# Patient Record
Sex: Male | Born: 1985 | Race: Black or African American | Hispanic: No | Marital: Married
Health system: Southern US, Community
[De-identification: ages and names within clinical notes are randomized; demographics above are authoritative.]

---
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
## 2006-03-04 IMAGING — CR DG ABDOMEN 1V
1 series · 1 of 1 positions shown · non-contrast
Comparison: None.

CLINICAL DATA: Abdominal pain.
 SINGLE VIEW ABDOMEN:

[view not recorded]
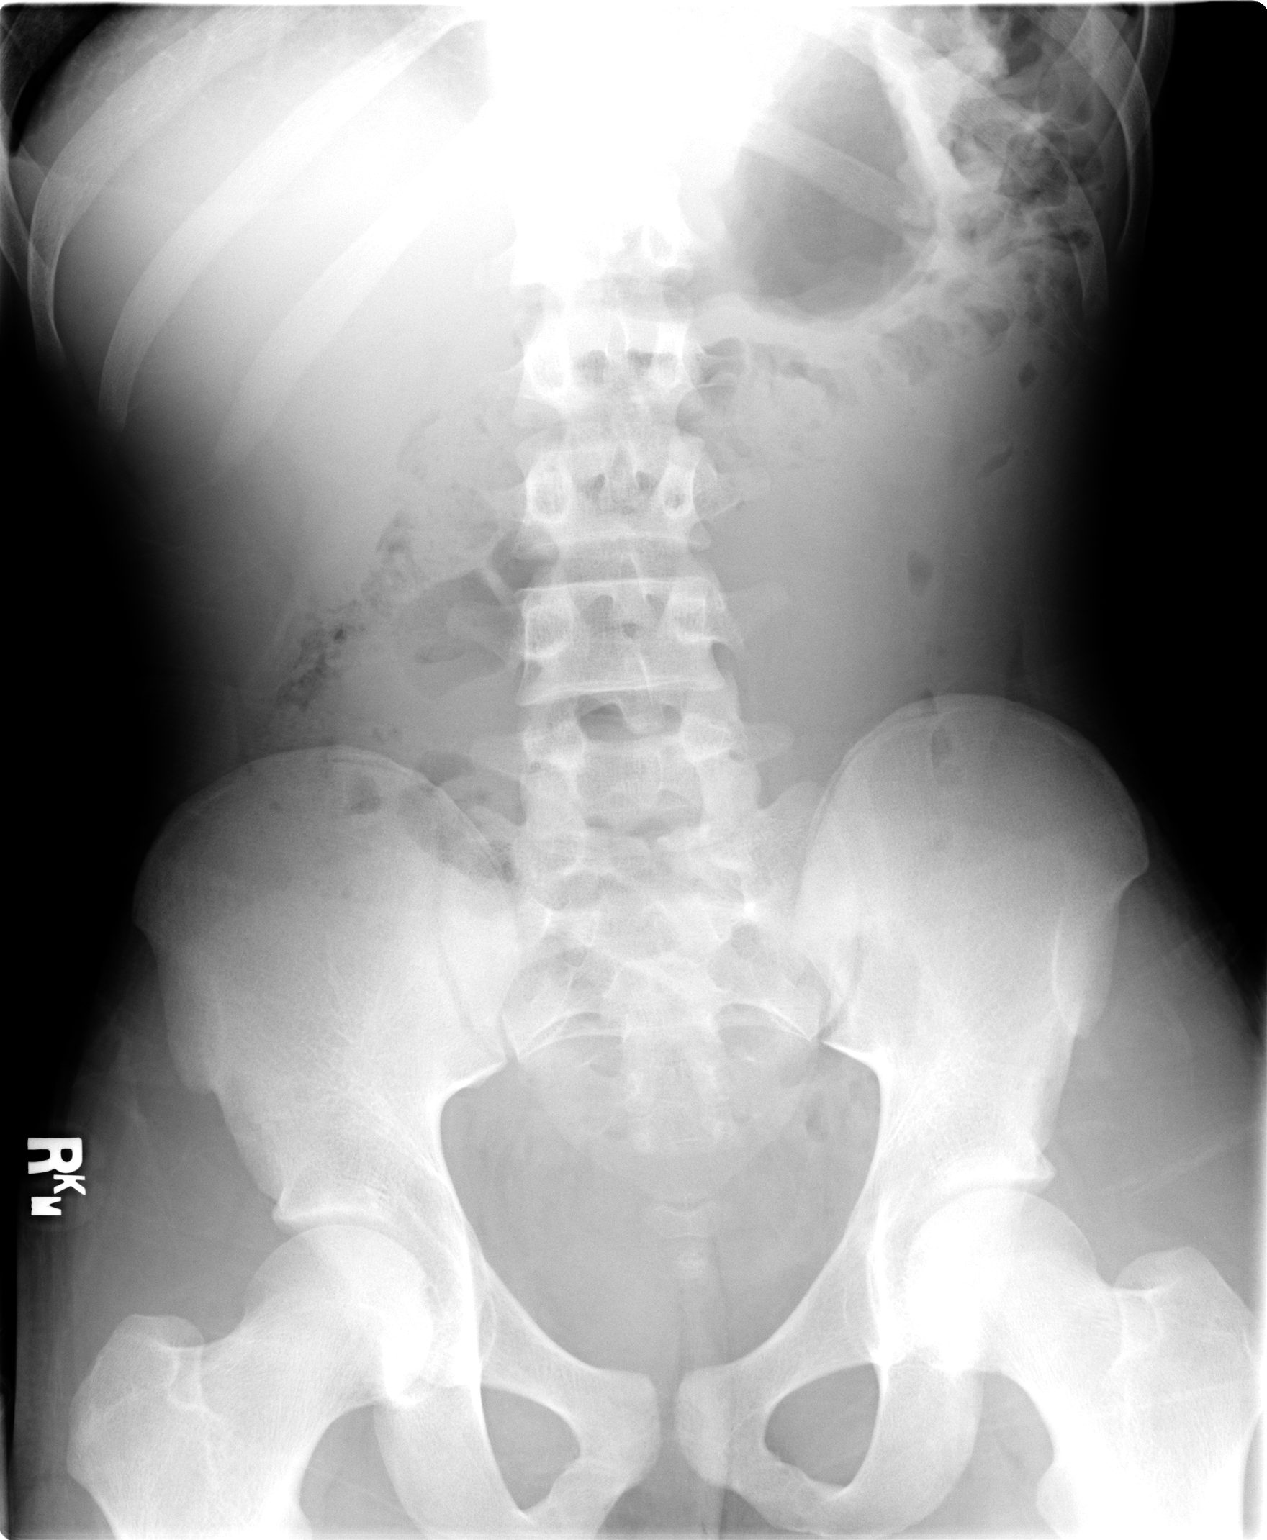

[1 of 1 positions shown; findings below may reference images not displayed]

Bowel gas pattern is unremarkable.  Air and stool are seen throughout the descending colon.  On single view, there is no definite evidence for free air.  Note is made of incomplete posterior fusion and a partially lumbarized S1 segment.  The axial skeleton is otherwise unremarkable.
IMPRESSION: Negative abdomen.

## 2011-03-31 ENCOUNTER — Emergency Department (HOSPITAL_COMMUNITY)
Admission: EM | Admit: 2011-03-31 | Discharge: 2011-03-31 | Disposition: A | Discharge status: Home / Self Care | Payer: Medicaid Other | Attending: Emergency Medicine | Admitting: Emergency Medicine

## 2011-03-31 DIAGNOSIS — F17200 Nicotine dependence, unspecified, uncomplicated: Secondary | ICD-10-CM | POA: Insufficient documentation

## 2011-03-31 DIAGNOSIS — M436 Torticollis: Secondary | ICD-10-CM | POA: Insufficient documentation

## 2022-05-09 ENCOUNTER — Other Ambulatory Visit (HOSPITAL_BASED_OUTPATIENT_CLINIC_OR_DEPARTMENT_OTHER): Payer: Self-pay

## 2022-05-09 DIAGNOSIS — R5383 Other fatigue: Secondary | ICD-10-CM

## 2022-05-09 DIAGNOSIS — R0681 Apnea, not elsewhere classified: Secondary | ICD-10-CM

## 2022-05-09 DIAGNOSIS — R0683 Snoring: Secondary | ICD-10-CM

## 2022-05-27 ENCOUNTER — Ambulatory Visit: Payer: BC Managed Care – PPO | Attending: Physician Assistant | Admitting: Neurology

## 2022-05-27 DIAGNOSIS — R5383 Other fatigue: Secondary | ICD-10-CM | POA: Diagnosis not present

## 2022-05-27 DIAGNOSIS — R0681 Apnea, not elsewhere classified: Secondary | ICD-10-CM | POA: Insufficient documentation

## 2022-05-27 DIAGNOSIS — R0683 Snoring: Secondary | ICD-10-CM | POA: Insufficient documentation

## 2022-06-04 NOTE — Procedures (Signed)
  [facility] Reta Norgren A. Gerilyn Pilgrim, MD     [email or web address]             HOME SLEEP TEST  LOCATION: [facility]  Patient Name: Brent Patel, Brent Patel Date: 05/28/2022 Gender: Male D.O.B: 09/12/1986 Age (years): 36 Referring Provider: Aline Brochure PA-C Height (inches): 67 Interpreting Physician: Beryle Beams MD, ABSM Weight (lbs): 150 RPSGT: Alfonso Ellis BMI: 23 MRN: [id number] Neck Size: CLINICAL INFORMATION Sleep Study Type: HST     Indication for sleep study: N/A     Epworth Sleepiness Score: N/A  SLEEP STUDY TECHNIQUE A multi-channel overnight portable sleep study was performed. The channels recorded were: nasal airflow, thoracic respiratory movement, and oxygen saturation with a pulse oximetry. Snoring was also monitored.  MEDICATIONS Patient self administered medications include: N/A. No current outpatient medications on file.    SLEEP ARCHITECTURE Patient was studied for 407.1 minutes. The sleep efficiency was 98.7 % and the patient was supine for 0%. The arousal index was 0.0 per hour.  RESPIRATORY PARAMETERS The overall AHI was 1.6 per hour, with a central apnea index of 0 per hour.  The oxygen nadir was 91% during sleep.     CARDIAC DATA Mean heart rate during sleep was 62.5 bpm.  IMPRESSIONS No significant obstructive sleep apnea occurred during this study (AHI = 1.6/h).   Argie Ramming, MD Diplomate, American Board of Sleep Medicine.  ELECTRONICALLY SIGNED ON:  06/04/2022, 3:12 PM [facility] PH: [phone]   FX: [phone] ACCREDITED BY THE AMERICAN ACADEMY OF SLEEP MEDICINE
